# Patient Record
Sex: Female | Born: 1965 | Race: White | Hispanic: No | Marital: Married | Smoking: Never smoker
Health system: Southern US, Community
[De-identification: ages and names within clinical notes are randomized; demographics above are authoritative.]

## PROBLEM LIST (undated history)

## (undated) DIAGNOSIS — N393 Stress incontinence (female) (male): Secondary | ICD-10-CM

---

## 1998-06-04 ENCOUNTER — Other Ambulatory Visit: Admission: RE | Admit: 1998-06-04 | Discharge: 1998-06-04 | Payer: Self-pay | Admitting: *Deleted

## 1999-06-06 ENCOUNTER — Other Ambulatory Visit: Admission: RE | Admit: 1999-06-06 | Discharge: 1999-06-06 | Payer: Self-pay | Admitting: *Deleted

## 1999-07-16 ENCOUNTER — Encounter: Payer: Self-pay | Admitting: *Deleted

## 1999-07-16 ENCOUNTER — Encounter: Admission: RE | Admit: 1999-07-16 | Discharge: 1999-07-16 | Payer: Self-pay | Admitting: Family Medicine

## 2000-06-16 ENCOUNTER — Other Ambulatory Visit: Admission: RE | Admit: 2000-06-16 | Discharge: 2000-06-16 | Payer: Self-pay | Admitting: *Deleted

## 2001-06-23 ENCOUNTER — Encounter: Payer: Self-pay | Admitting: *Deleted

## 2001-06-23 ENCOUNTER — Other Ambulatory Visit: Admission: RE | Admit: 2001-06-23 | Discharge: 2001-06-23 | Payer: Self-pay | Admitting: Obstetrics and Gynecology

## 2001-06-23 ENCOUNTER — Encounter: Admission: RE | Admit: 2001-06-23 | Discharge: 2001-06-23 | Payer: Self-pay | Admitting: *Deleted

## 2002-07-11 ENCOUNTER — Other Ambulatory Visit: Admission: RE | Admit: 2002-07-11 | Discharge: 2002-07-11 | Payer: Self-pay | Admitting: Obstetrics and Gynecology

## 2003-07-13 ENCOUNTER — Other Ambulatory Visit: Admission: RE | Admit: 2003-07-13 | Discharge: 2003-07-13 | Payer: Self-pay | Admitting: Obstetrics and Gynecology

## 2015-03-05 ENCOUNTER — Encounter (HOSPITAL_COMMUNITY): Payer: Self-pay | Admitting: Emergency Medicine

## 2015-03-05 ENCOUNTER — Emergency Department (HOSPITAL_COMMUNITY)
Admission: EM | Admit: 2015-03-05 | Discharge: 2015-03-05 | Disposition: A | Discharge status: Home / Self Care | Payer: BLUE CROSS/BLUE SHIELD | Attending: Emergency Medicine | Admitting: Emergency Medicine

## 2015-03-05 ENCOUNTER — Emergency Department (HOSPITAL_COMMUNITY): Payer: BLUE CROSS/BLUE SHIELD

## 2015-03-05 DIAGNOSIS — Z87448 Personal history of other diseases of urinary system: Secondary | ICD-10-CM | POA: Diagnosis not present

## 2015-03-05 DIAGNOSIS — Z79899 Other long term (current) drug therapy: Secondary | ICD-10-CM | POA: Insufficient documentation

## 2015-03-05 DIAGNOSIS — R202 Paresthesia of skin: Secondary | ICD-10-CM | POA: Diagnosis not present

## 2015-03-05 DIAGNOSIS — Z792 Long term (current) use of antibiotics: Secondary | ICD-10-CM | POA: Insufficient documentation

## 2015-03-05 DIAGNOSIS — Z7951 Long term (current) use of inhaled steroids: Secondary | ICD-10-CM | POA: Diagnosis not present

## 2015-03-05 HISTORY — DX: Stress incontinence (female) (male): N39.3

## 2015-03-05 LAB — CBC
HCT: 40.6 % (ref 36.0–46.0)
Hemoglobin: 14.5 g/dL (ref 12.0–15.0)
MCH: 31.5 pg (ref 26.0–34.0)
MCHC: 35.7 g/dL (ref 30.0–36.0)
MCV: 88.3 fL (ref 78.0–100.0)
PLATELETS: 180 10*3/uL (ref 150–400)
RBC: 4.6 MIL/uL (ref 3.87–5.11)
RDW: 13.1 % (ref 11.5–15.5)
WBC: 5.8 10*3/uL (ref 4.0–10.5)

## 2015-03-05 LAB — RAPID URINE DRUG SCREEN, HOSP PERFORMED
AMPHETAMINES: NOT DETECTED
Barbiturates: NOT DETECTED
Benzodiazepines: NOT DETECTED
Cocaine: NOT DETECTED
OPIATES: NOT DETECTED
Tetrahydrocannabinol: NOT DETECTED

## 2015-03-05 LAB — URINALYSIS, ROUTINE W REFLEX MICROSCOPIC
Bilirubin Urine: NEGATIVE
Glucose, UA: NEGATIVE mg/dL
Ketones, ur: NEGATIVE mg/dL
LEUKOCYTES UA: NEGATIVE
NITRITE: NEGATIVE
Protein, ur: NEGATIVE mg/dL
SPECIFIC GRAVITY, URINE: 1.014 (ref 1.005–1.030)
UROBILINOGEN UA: 0.2 mg/dL (ref 0.0–1.0)
pH: 5.5 (ref 5.0–8.0)

## 2015-03-05 LAB — COMPREHENSIVE METABOLIC PANEL
ALK PHOS: 46 U/L (ref 38–126)
ALT: 24 U/L (ref 14–54)
ANION GAP: 6 (ref 5–15)
AST: 32 U/L (ref 15–41)
Albumin: 4.6 g/dL (ref 3.5–5.0)
BUN: 18 mg/dL (ref 6–20)
CALCIUM: 9.3 mg/dL (ref 8.9–10.3)
CO2: 25 mmol/L (ref 22–32)
Chloride: 107 mmol/L (ref 101–111)
Creatinine, Ser: 0.75 mg/dL (ref 0.44–1.00)
GFR calc non Af Amer: >60 mL/min (ref >60)
Glucose, Bld: 94 mg/dL (ref 65–99)
Potassium: 4 mmol/L (ref 3.5–5.1)
SODIUM: 138 mmol/L (ref 135–145)
TOTAL PROTEIN: 7.9 g/dL (ref 6.5–8.1)
Total Bilirubin: 0.6 mg/dL (ref 0.3–1.2)

## 2015-03-05 LAB — DIFFERENTIAL
Basophils Absolute: 0 10*3/uL (ref 0.0–0.1)
Basophils Relative: 0 % (ref 0–1)
EOS PCT: 1 % (ref 0–5)
Eosinophils Absolute: 0 10*3/uL (ref 0.0–0.7)
LYMPHS PCT: 25 % (ref 12–46)
Lymphs Abs: 1.5 10*3/uL (ref 0.7–4.0)
MONO ABS: 0.3 10*3/uL (ref 0.1–1.0)
Monocytes Relative: 5 % (ref 3–12)
Neutro Abs: 4 10*3/uL (ref 1.7–7.7)
Neutrophils Relative %: 69 % (ref 43–77)

## 2015-03-05 LAB — URINE MICROSCOPIC-ADD ON

## 2015-03-05 LAB — APTT: aPTT: 27 seconds (ref 24–37)

## 2015-03-05 LAB — PROTIME-INR
INR: 1.05 (ref 0.00–1.49)
PROTHROMBIN TIME: 13.9 s (ref 11.6–15.2)

## 2015-03-05 LAB — I-STAT TROPONIN, ED: TROPONIN I, POC: 0 ng/mL (ref 0.00–0.08)

## 2015-03-05 LAB — ETHANOL

## 2015-03-05 NOTE — ED Provider Notes (Addendum)
CSN: [id number]     Arrival date & time 03/05/15  8295 History   First MD Initiated Contact with Patient 03/05/15 0845     Chief Complaint  Patient presents with  . arm tingling      (Consider location/radiation/quality/duration/timing/severity/associated sxs/prior Treatment) HPI   Diamond Shaffer is a 49 y.o. female presents for evaluation of a sensation of left hand swelling, heaviness in left arm and tingling in left arm, which began yesterday at 11 AM. Today she has also noticed a sensation of heaviness and tingling in her left face. She denies headache, fever, chills, dizziness, change in vision or hearing, chest pain or shortness of breath. She's never had these problems previously. She's never had any cardiac problems. There are no other known modifying factors.  Past Medical History  Diagnosis Date  . Stress bladder incontinence, female    History reviewed. No pertinent past surgical history. No family history on file. Social History  Substance Use Topics  . Smoking status: Never Smoker   . Smokeless tobacco: None  . Alcohol Use: No   OB History    No data available     Review of Systems  All other systems reviewed and are negative.     Allergies  Aleve; Ciprofloxacin; Macrodantin; Macrolides and ketolides; and Sulfur  Home Medications   Prior to Admission medications   Medication Sig Start Date End Date Taking? Authorizing Provider  CHERRY PO Take 1 tablet by mouth daily.   Yes Historical Provider, MD  Cholecalciferol (VITAMIN D-3 PO) Take 1 tablet by mouth daily.   Yes Historical Provider, MD  CRANBERRY PO Take 1 tablet by mouth daily.   Yes Historical Provider, MD  fluticasone (FLONASE) 50 MCG/ACT nasal spray Place 1 spray into both nostrils daily.   Yes Historical Provider, MD  Ginger, Zingiber officinalis, (GINGER ROOT PO) Take 1 tablet by mouth daily.   Yes Historical Provider, MD  Multiple Vitamins-Minerals (MULTIVITAMIN WITH MINERALS) tablet Take 1 tablet  by mouth daily.   Yes Historical Provider, MD  Omega-3 Fatty Acids (FISH OIL PO) Take 1 capsule by mouth daily.   Yes Historical Provider, MD  oxybutynin (DITROPAN) 5 MG tablet Take 5 mg by mouth 2 (two) times daily.   Yes Historical Provider, MD  Pyridoxine HCl (VITAMIN B-6 PO) Take 1 tablet by mouth daily.   Yes Historical Provider, MD  TURMERIC PO Take 1 tablet by mouth daily.   Yes Historical Provider, MD  cephALEXin (KEFLEX) 500 MG capsule Take 1 capsule by mouth 2 (two) times daily. 02/19/15   Historical Provider, MD   BP 123/59 mmHg  Pulse 80  Temp(Src) 98.4 F (36.9 C) (Oral)  Resp 16  SpO2 99%  LMP 02/25/2015 Physical Exam  Constitutional: She is oriented to person, place, and time. She appears well-developed and well-nourished.  HENT:  Head: Normocephalic and atraumatic.  Right Ear: External ear normal.  Left Ear: External ear normal.  Eyes: Conjunctivae and EOM are normal. Pupils are equal, round, and reactive to light.  Neck: Normal range of motion and phonation normal. Neck supple.  Cardiovascular: Normal rate, regular rhythm and normal heart sounds.   Pulmonary/Chest: Effort normal and breath sounds normal. She exhibits no bony tenderness.  Abdominal: Soft. There is no tenderness.  Musculoskeletal: Normal range of motion.  Neurological: She is alert and oriented to person, place, and time. No cranial nerve deficit or sensory deficit. She exhibits normal muscle tone. Coordination normal.  No dysarthria and aphasia or nystagmus.  No pronator drift. No ataxia. Normal gait. No altered light touch sensation of the face, arms or legs.  Skin: Skin is warm, dry and intact.  Psychiatric: She has a normal mood and affect. Her behavior is normal. Judgment and thought content normal.  Nursing note and vitals reviewed.   ED Course  Procedures (including critical care time)  Initial evaluation, is concerning for subacute CVA, with very low NIH score. The patient will be evaluated  initially with an MRI of the brain since a head CT is expected to be negative, because of the mild symptoms. The patient does not meet criteria for thrombolysis because of extended duration of symptoms and low level NIH score.  Medications - No data to display  Patient Vitals for the past 24 hrs:  BP Temp Temp src Pulse Resp SpO2  03/05/15 1223 123/59 mmHg 98.4 F (36.9 C) - 80 16 99 %  03/05/15 0843 (!) 130/53 mmHg 98 F (36.7 C) Oral 84 16 96 %   12:30- . I discussed the case with the on-call neuro hospitalist, Dr. Petra Kuba. He recommended patient follow-up with a neurologist as an outpatient for further evaluation of paresthesia  12:40 PM Reevaluation with update and discussion. After initial assessment and treatment, an updated evaluation reveals she states the tingling in her left arm has improved but not resolved and she has mild persistent tingling in her left face. Findings discussed with the patient, all questions were answered. Diamond Shaffer    Labs Review Labs Reviewed  URINALYSIS, ROUTINE W REFLEX MICROSCOPIC (NOT AT [facility]) - Abnormal; Notable for the following:    APPearance CLOUDY (*)    Hgb urine dipstick TRACE (*)    All other components within normal limits  URINE MICROSCOPIC-ADD ON - Abnormal; Notable for the following:    Squamous Epithelial / LPF MANY (*)    Bacteria, UA FEW (*)    All other components within normal limits  ETHANOL  PROTIME-INR  APTT  CBC  DIFFERENTIAL  COMPREHENSIVE METABOLIC PANEL  URINE RAPID DRUG SCREEN, HOSP PERFORMED  I-STAT TROPOININ, ED    Imaging Review Mr Brain Wo Contrast  03/05/2015   CLINICAL DATA:  49 year old female with swelling and tingling in the left upper extremity. Numbness. Initial encounter.  EXAM: MRI HEAD WITHOUT CONTRAST  TECHNIQUE: Multiplanar, multiecho pulse sequences of the brain and surrounding structures were obtained without intravenous contrast.  COMPARISON:  None.  FINDINGS: Cerebral volume is normal. No  restricted diffusion to suggest acute infarction. No midline shift, mass effect, evidence of mass lesion, ventriculomegaly, extra-axial collection or acute intracranial hemorrhage. Cervicomedullary junction and pituitary are within normal limits. Negative visualized cervical spine. Major intracranial vascular flow voids are within normal limits. Pneumatized anterior clinoid processes incidentally noted (normal anatomic variant). Wallace Cullens and white matter signal is within normal limits throughout the brain. No encephalomalacia or chronic cerebral blood products identified.  Visible internal auditory structures appear normal. Mastoids are clear. Multiple small mucous retention cysts in the paranasal sinuses, maxillary sinuses are most affected. Negative orbit and scalp soft tissues. Normal bone marrow signal.  IMPRESSION: 1.  Normal noncontrast MRI appearance of the brain. 2. Paranasal sinus mucous retention cysts.   Electronically Signed   By: Odessa Fleming M.D.   On: 03/05/2015 11:58   I have personally reviewed and evaluated these images and lab results as part of my medical decision-making.   EKG Interpretation   Date/Time:  Tuesday March 05 2015 09:35:08 EDT Ventricular Rate:  78 PR Interval:  136 QRS Duration: 76 QT Interval:  356 QTC Calculation: 405 R Axis:   64 Text Interpretation:  Sinus rhythm Atrial premature complex No old tracing  to compare Confirmed by [facility]  MD, Tajah Noguchi ([id number]) on 03/05/2015 11:19:48 AM      MDM   Final diagnoses:  Paresthesia    Nonspecific left-sided paresthesia and heaviness. Evaluation for CVA, cranial tumor and metabolic abnormalities are all negative. I doubt that this represents a TIA, CVA, seizures bacterial infection or that her symptoms will progress.  Nursing Notes Reviewed/ Care Coordinated Applicable Imaging Reviewed Interpretation of Laboratory Data incorporated into ED treatment  The patient appears reasonably screened and/or stabilized for  discharge and I doubt any other medical condition or other [facility] requiring further screening, evaluation, or treatment in the ED at this time prior to discharge.  Plan: Home Medications- usual; Home Treatments- rest; return here if the recommended treatment, does not improve the symptoms; Recommended follow up- Neuro f/u 1-2 weeks     Mancel Bale, MD 03/05/15 1245  Mancel Bale, MD 03/05/15 [phone]

## 2015-03-05 NOTE — Progress Notes (Signed)
Pt discharging from [facility] ED. Cm spoke with pt about a doctor to follow up with Pt states she was seeing providers at [facility] but has requested her dr to be changed to [facility] States she completed a release form but when she called today It had not been completed Cm offered to provide a list of [employer] providers in network for her but pt states she is "okay" and she will contact [email or web address] to see why and what needs to be done to assist with getting her records transferred to [facility]

## 2015-03-05 NOTE — ED Notes (Signed)
Per pt, states she had swelling in left wrist yesterday-now having tingling from left elbow to left had-patient having multiple compliants-states has tingling in back as well

## 2021-06-09 ENCOUNTER — Other Ambulatory Visit (HOSPITAL_COMMUNITY): Payer: Self-pay | Admitting: Family Medicine

## 2021-06-09 ENCOUNTER — Other Ambulatory Visit (HOSPITAL_COMMUNITY): Payer: Self-pay | Admitting: *Deleted

## 2021-06-09 DIAGNOSIS — Z1231 Encounter for screening mammogram for malignant neoplasm of breast: Secondary | ICD-10-CM

## 2021-10-30 ENCOUNTER — Encounter (HOSPITAL_COMMUNITY): Payer: Self-pay | Admitting: Radiology

## 2021-10-30 ENCOUNTER — Ambulatory Visit (HOSPITAL_COMMUNITY)
Admission: RE | Admit: 2021-10-30 | Discharge: 2021-10-30 | Disposition: A | Discharge status: Home / Self Care | Payer: BC Managed Care – PPO | Source: Ambulatory Visit | Attending: Family Medicine | Admitting: Family Medicine

## 2021-10-30 DIAGNOSIS — Z1231 Encounter for screening mammogram for malignant neoplasm of breast: Secondary | ICD-10-CM | POA: Insufficient documentation

## 2021-11-10 ENCOUNTER — Inpatient Hospital Stay
Admission: RE | Admit: 2021-11-10 | Discharge: 2021-11-10 | Disposition: A | Discharge status: Home / Self Care | Payer: Self-pay | Source: Ambulatory Visit | Attending: Family Medicine | Admitting: Family Medicine

## 2021-11-10 ENCOUNTER — Other Ambulatory Visit (HOSPITAL_COMMUNITY): Payer: Self-pay | Admitting: Family Medicine

## 2021-11-10 DIAGNOSIS — Z1231 Encounter for screening mammogram for malignant neoplasm of breast: Secondary | ICD-10-CM

## 2021-11-21 ENCOUNTER — Ambulatory Visit (HOSPITAL_COMMUNITY): Payer: BLUE CROSS/BLUE SHIELD

## 2022-07-06 ENCOUNTER — Other Ambulatory Visit (HOSPITAL_COMMUNITY): Payer: Self-pay | Admitting: Family Medicine

## 2022-07-06 DIAGNOSIS — Z1231 Encounter for screening mammogram for malignant neoplasm of breast: Secondary | ICD-10-CM

## 2022-11-13 ENCOUNTER — Ambulatory Visit (HOSPITAL_COMMUNITY)
Admission: RE | Admit: 2022-11-13 | Discharge: 2022-11-13 | Disposition: A | Discharge status: Home / Self Care | Payer: BC Managed Care – PPO | Source: Ambulatory Visit | Attending: Family Medicine | Admitting: Family Medicine

## 2022-11-13 DIAGNOSIS — Z1231 Encounter for screening mammogram for malignant neoplasm of breast: Secondary | ICD-10-CM | POA: Diagnosis present

## 2023-04-08 IMAGING — MG MM DIGITAL SCREENING BILAT W/ TOMO AND CAD
6 of 10 series · 6 of 30 positions shown · non-contrast
Comparison: Previous exam(s).

CLINICAL DATA: Screening.

EXAM:
DIGITAL SCREENING BILATERAL MAMMOGRAM WITH TOMOSYNTHESIS AND CAD
TECHNIQUE: Bilateral screening digital craniocaudal and mediolateral oblique
mammograms were obtained. Bilateral screening digital breast
tomosynthesis was performed. The images were evaluated with
computer-aided detection.

[L CC synth-2D (1 of 2)]
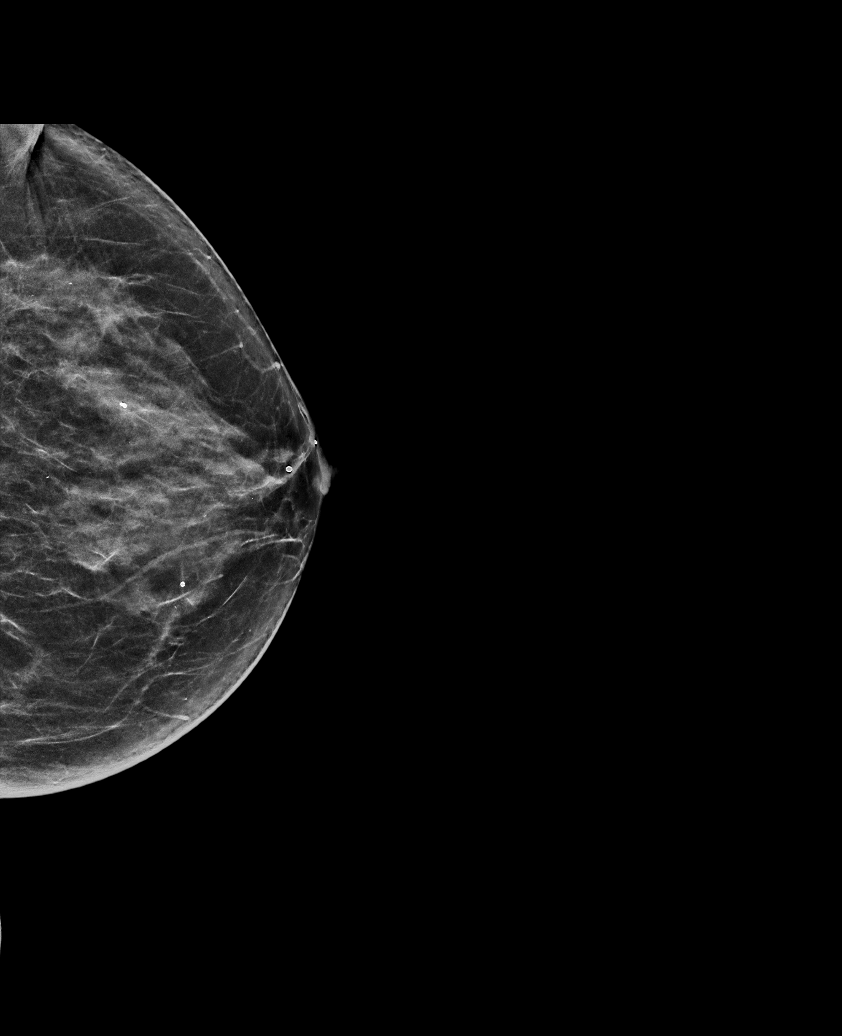

[L CC synth-2D (2 of 2)]
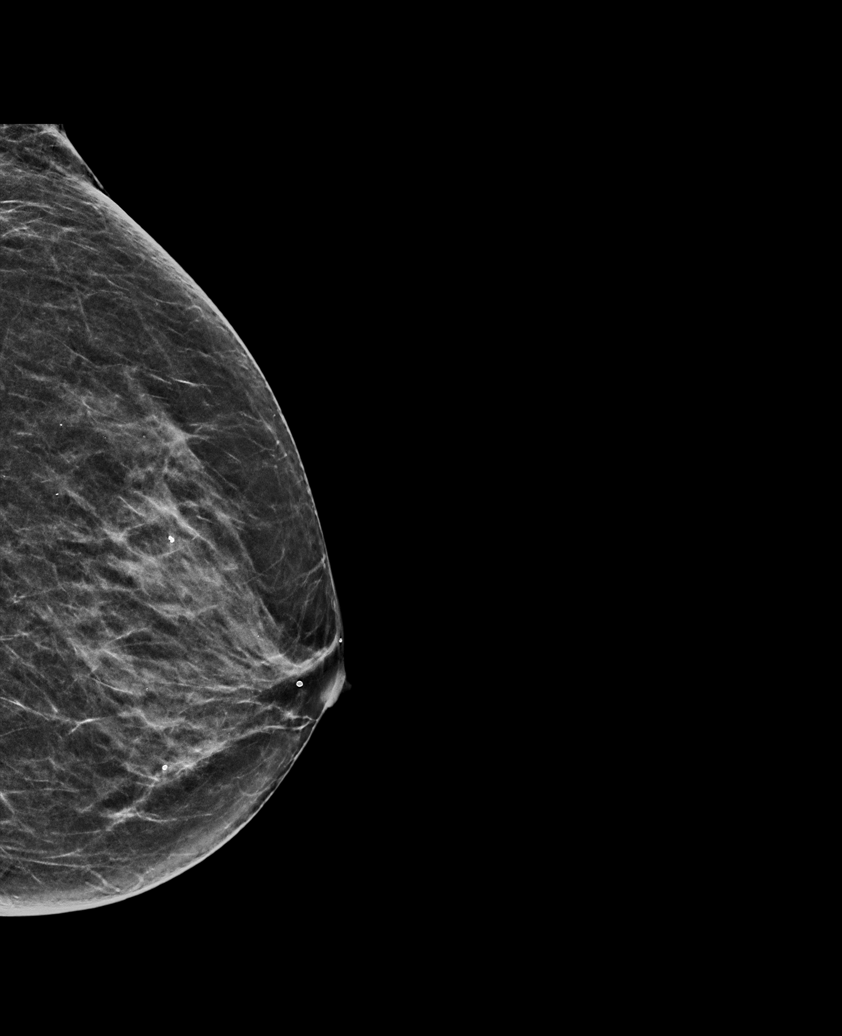

[R CC synth-2D]
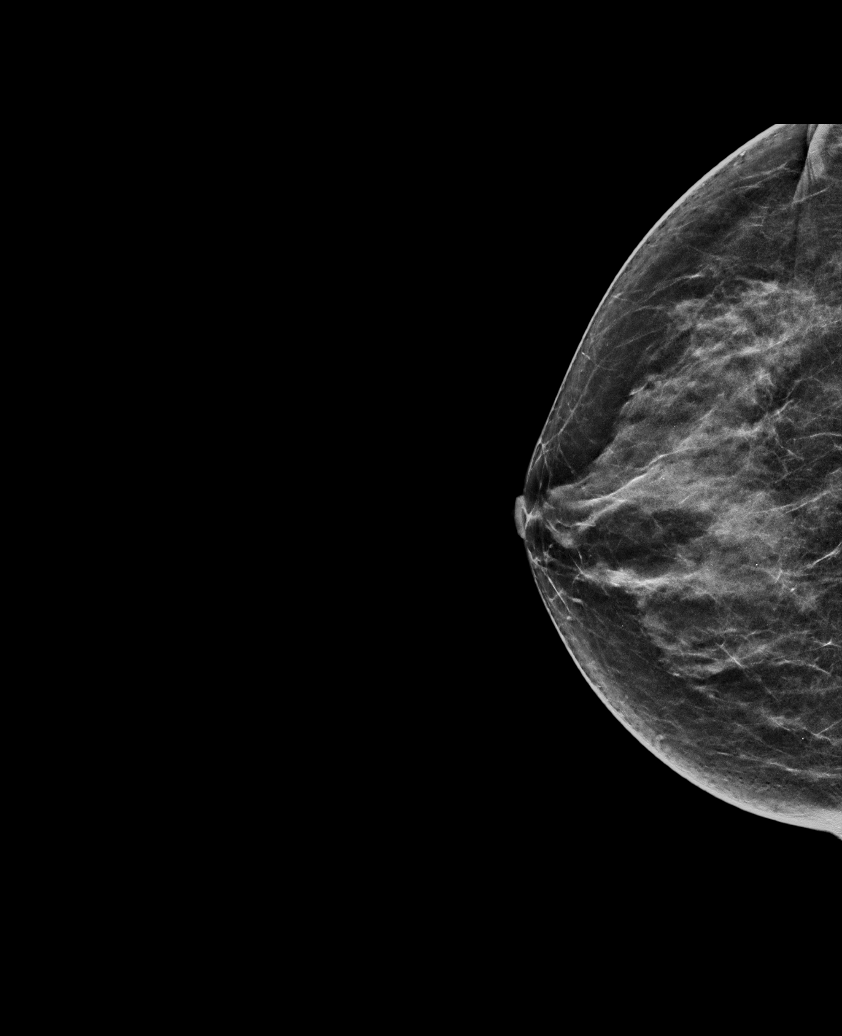

[R MLO synth-2D]
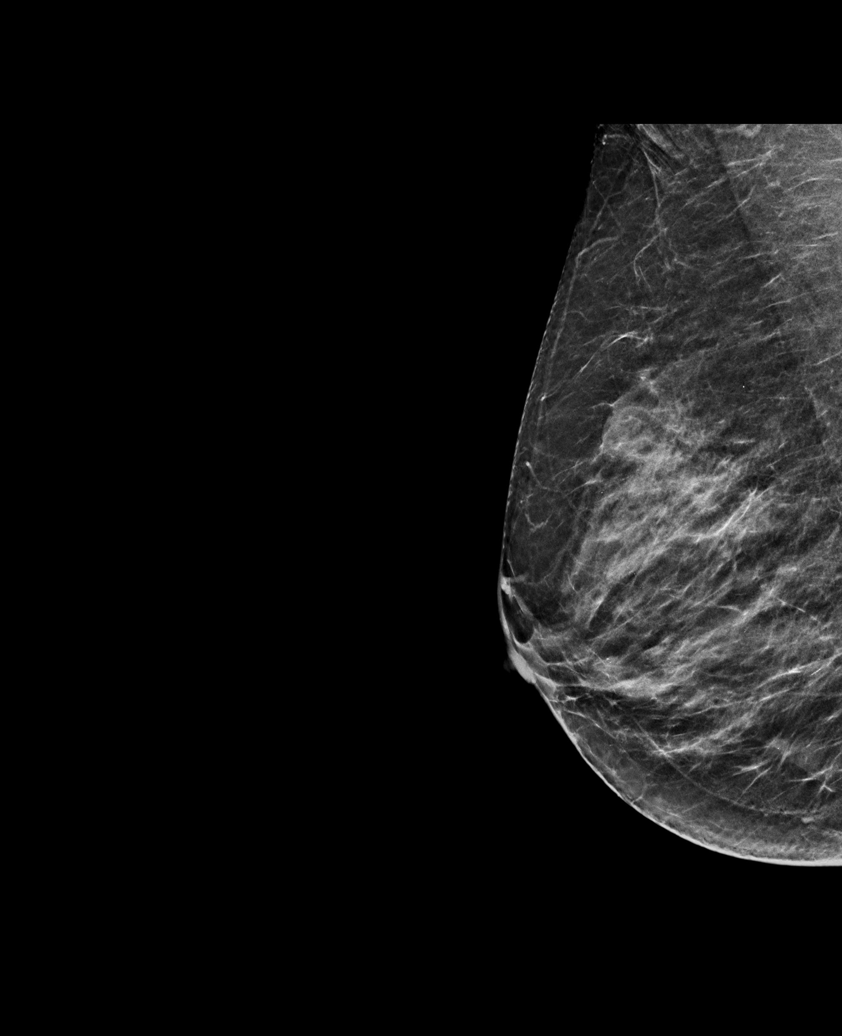

[L MLO synth-2D]
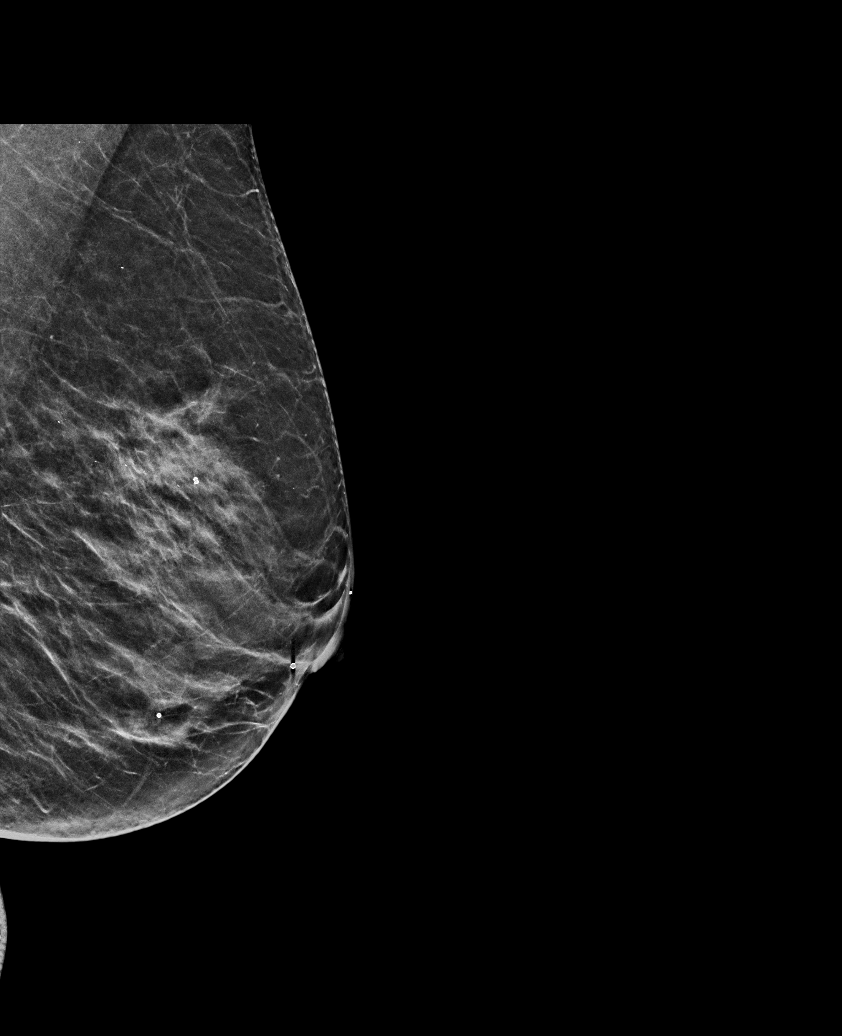

[R MLO tomo · tomo slice 29/56.0]
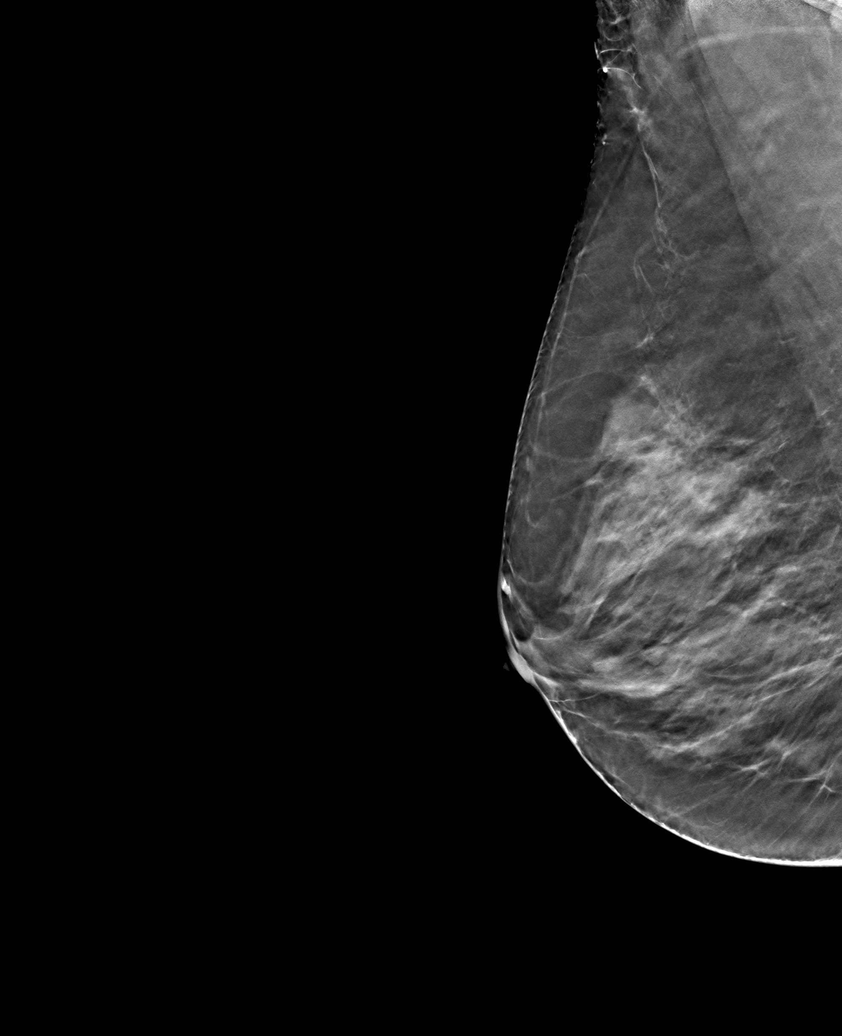

[6 of 30 positions shown; findings below may reference images not displayed]

ACR Breast Density Category c: The breast tissue is heterogeneously
dense, which may obscure small masses.
FINDINGS: There are no findings suspicious for malignancy.
IMPRESSION: No mammographic evidence of malignancy. A result letter of this
screening mammogram will be mailed directly to the patient.

RECOMMENDATION:
Screening mammogram in one year. (Code:Q3-W-BC3)

BI-RADS CATEGORY  1: Negative.

## 2023-08-12 ENCOUNTER — Other Ambulatory Visit (HOSPITAL_COMMUNITY): Payer: Self-pay | Admitting: Physician Assistant

## 2023-08-12 ENCOUNTER — Other Ambulatory Visit: Payer: Self-pay | Admitting: Physician Assistant

## 2023-08-12 DIAGNOSIS — Z1231 Encounter for screening mammogram for malignant neoplasm of breast: Secondary | ICD-10-CM

## 2023-11-08 ENCOUNTER — Other Ambulatory Visit: Payer: Self-pay | Admitting: Medical Genetics

## 2023-11-08 ENCOUNTER — Encounter (HOSPITAL_COMMUNITY): Payer: Self-pay

## 2023-11-15 ENCOUNTER — Ambulatory Visit (HOSPITAL_COMMUNITY)
Admission: RE | Admit: 2023-11-15 | Discharge: 2023-11-15 | Disposition: A | Discharge status: Home / Self Care | Payer: BC Managed Care – PPO | Source: Ambulatory Visit | Attending: Physician Assistant | Admitting: Physician Assistant

## 2023-11-15 ENCOUNTER — Ambulatory Visit: Payer: BC Managed Care – PPO

## 2023-11-15 ENCOUNTER — Other Ambulatory Visit (HOSPITAL_COMMUNITY)
Admission: RE | Admit: 2023-11-15 | Discharge: 2023-11-15 | Disposition: A | Discharge status: Home / Self Care | Payer: Self-pay | Source: Ambulatory Visit | Attending: Oncology | Admitting: Oncology

## 2023-11-15 DIAGNOSIS — Z1231 Encounter for screening mammogram for malignant neoplasm of breast: Secondary | ICD-10-CM | POA: Diagnosis present

## 2023-11-23 LAB — GENECONNECT MOLECULAR SCREEN: Genetic Analysis Overall Interpretation: NEGATIVE
